# Patient Record
Sex: Female | Born: 1980 | Race: White | Hispanic: No | Marital: Single | Smoking: Current some day smoker
Health system: Southern US, Community
[De-identification: ages and names within clinical notes are randomized; demographics above are authoritative.]

## PROBLEM LIST (undated history)

## (undated) DIAGNOSIS — N200 Calculus of kidney: Secondary | ICD-10-CM

---

## 2014-10-10 ENCOUNTER — Emergency Department (HOSPITAL_BASED_OUTPATIENT_CLINIC_OR_DEPARTMENT_OTHER): Payer: Self-pay

## 2014-10-10 ENCOUNTER — Emergency Department (HOSPITAL_BASED_OUTPATIENT_CLINIC_OR_DEPARTMENT_OTHER)
Admission: EM | Admit: 2014-10-10 | Discharge: 2014-10-10 | Disposition: A | Discharge status: Home / Self Care | Payer: Self-pay | Attending: Emergency Medicine | Admitting: Emergency Medicine

## 2014-10-10 ENCOUNTER — Encounter (HOSPITAL_BASED_OUTPATIENT_CLINIC_OR_DEPARTMENT_OTHER): Payer: Self-pay | Admitting: Emergency Medicine

## 2014-10-10 DIAGNOSIS — K088 Other specified disorders of teeth and supporting structures: Secondary | ICD-10-CM | POA: Insufficient documentation

## 2014-10-10 DIAGNOSIS — K029 Dental caries, unspecified: Secondary | ICD-10-CM

## 2014-10-10 DIAGNOSIS — M549 Dorsalgia, unspecified: Secondary | ICD-10-CM | POA: Insufficient documentation

## 2014-10-10 DIAGNOSIS — N200 Calculus of kidney: Secondary | ICD-10-CM | POA: Insufficient documentation

## 2014-10-10 DIAGNOSIS — Z720 Tobacco use: Secondary | ICD-10-CM | POA: Insufficient documentation

## 2014-10-10 HISTORY — DX: Calculus of kidney: N20.0

## 2014-10-10 LAB — URINALYSIS, ROUTINE W REFLEX MICROSCOPIC
GLUCOSE, UA: NEGATIVE mg/dL
Ketones, ur: 15 mg/dL — AB
NITRITE: NEGATIVE
Protein, ur: NEGATIVE mg/dL
SPECIFIC GRAVITY, URINE: 1.018 (ref 1.005–1.030)
Urobilinogen, UA: 0.2 mg/dL (ref 0.0–1.0)
pH: 7 (ref 5.0–8.0)

## 2014-10-10 LAB — URINE MICROSCOPIC-ADD ON

## 2014-10-10 LAB — PREGNANCY, URINE: PREG TEST UR: NEGATIVE

## 2014-10-10 MED ORDER — PENICILLIN V POTASSIUM 500 MG PO TABS: 500.0000 mg | ORAL_TABLET | Freq: Three times a day (TID) | ORAL | Status: AC

## 2014-10-10 MED ORDER — TRAMADOL HCL 50 MG PO TABS: 50.0000 mg | ORAL_TABLET | Freq: Four times a day (QID) | ORAL | Status: AC | PRN

## 2014-10-10 MED ORDER — PENICILLIN V POTASSIUM 250 MG PO TABS
500.0000 mg | ORAL_TABLET | Freq: Once | ORAL | Status: AC
  Administered 2014-10-10: 500 mg via ORAL
  Filled 2014-10-10: qty 2

## 2014-10-10 MED ORDER — OXYCODONE-ACETAMINOPHEN 5-325 MG PO TABS
2.0000 | ORAL_TABLET | Freq: Once | ORAL | Status: AC
  Administered 2014-10-10: 2 via ORAL
  Filled 2014-10-10: qty 2

## 2014-10-10 NOTE — ED Provider Notes (Signed)
CSN: [id number]     Arrival date & time 10/10/14  1050 History   First MD Initiated Contact with Patient 10/10/14 1243     Chief Complaint  Patient presents with  . Dental Pain     (Consider location/radiation/quality/duration/timing/severity/associated sxs/prior Treatment) HPI Comments: Patient presents with a toothache. She describes pain to her right lower molar. This been going on for about a week has been worsening since yesterday. She denies any facial swelling. There is no nausea vomiting or fevers. She also has a history of kidney stones and is complaining of some pain in her right back that is moved to her right lower abdomen. She states it's been going on for about a week but is also been worsening since that time. It waxes and wanes in intensity. She was seen by a [facility]ist in [facility] earlier in the week but states that she was unable to stay for further evaluation such as a CT scan at that time. She had to pick up her child from school. She's also requesting for this to be evaluated today. She does report some increased urinary urgency but no visible blood in her urine. She denies any vaginal bleeding or discharge.  Patient is a 33 y.o. female presenting with tooth pain.  Dental Pain Associated symptoms: no congestion, no fever and no headaches     Past Medical History  Diagnosis Date  . Kidney stones    History reviewed. No pertinent past surgical history. No family history on file. History  Substance Use Topics  . Smoking status: Current Some Day Smoker  . Smokeless tobacco: Not on file  . Alcohol Use: Yes     Comment: occ   OB History    No data available     Review of Systems  Constitutional: Negative for fever, chills, diaphoresis and fatigue.  HENT: Positive for dental problem. Negative for congestion, rhinorrhea and sneezing.   Eyes: Negative.   Respiratory: Negative for cough, chest tightness and shortness of breath.   Cardiovascular: Negative for chest  pain and leg swelling.  Gastrointestinal: Positive for abdominal pain. Negative for nausea, vomiting, diarrhea and blood in stool.  Genitourinary: Positive for dysuria and flank pain. Negative for frequency, hematuria, vaginal discharge, difficulty urinating and vaginal pain.  Musculoskeletal: Positive for back pain. Negative for arthralgias.  Skin: Negative for rash.  Neurological: Negative for dizziness, speech difficulty, weakness, numbness and headaches.      Allergies  Review of patient's allergies indicates no known allergies.  Home Medications   Prior to Admission medications   Medication Sig Start Date End Date Taking? Authorizing Provider  ibuprofen (ADVIL,MOTRIN) 600 MG tablet Take 600 mg by mouth every 6 (six) hours as needed.   Yes Historical Provider, MD  penicillin v potassium (VEETID) 500 MG tablet Take 1 tablet (500 mg total) by mouth 3 (three) times daily. 10/10/14   Rolan BuccoMelanie Erendira Crabtree, MD  traMADol (ULTRAM) 50 MG tablet Take 1 tablet (50 mg total) by mouth every 6 (six) hours as needed. 10/10/14   Rolan BuccoMelanie Indiah Heyden, MD   BP 111/74 mmHg  Pulse 106  Temp(Src) 98 F (36.7 C) (Oral)  Resp 18  Wt 172 lb 8 oz (78.245 kg)  SpO2 100%  LMP 09/16/2014 Physical Exam  Constitutional: She is oriented to person, place, and time. She appears well-developed and well-nourished.  HENT:  Head: Normocephalic and atraumatic.  Positive decayed tooth that is eroded down to the gumline. This is the right lower first molar. There is  no swelling or fluctuance around the tooth. No facial swelling. Uvula is midline. No trismus. No elevation the tongue.  Eyes: Pupils are equal, round, and reactive to light.  Neck: Normal range of motion. Neck supple.  Cardiovascular: Normal rate, regular rhythm and normal heart sounds.   Pulmonary/Chest: Effort normal and breath sounds normal. No respiratory distress. She has no wheezes. She has no rales. She exhibits no tenderness.  Abdominal: Soft. Bowel sounds  are normal. There is tenderness (positive tenderness in the right flank and right midabdomen). There is no rebound and no guarding.  Musculoskeletal: Normal range of motion. She exhibits no edema.  Lymphadenopathy:    She has no cervical adenopathy.  Neurological: She is alert and oriented to person, place, and time.  Skin: Skin is warm and dry. No rash noted.  Psychiatric: She has a normal mood and affect.    ED Course  Procedures (including critical care time) Labs Review Labs Reviewed  URINALYSIS, ROUTINE W REFLEX MICROSCOPIC - Abnormal; Notable for the following:    Color, Urine AMBER (*)    APPearance CLOUDY (*)    Hgb urine dipstick LARGE (*)    Bilirubin Urine SMALL (*)    Ketones, ur 15 (*)    Leukocytes, UA SMALL (*)    All other components within normal limits  URINE MICROSCOPIC-ADD ON - Abnormal; Notable for the following:    Squamous Epithelial / LPF FEW (*)    Bacteria, UA MANY (*)    All other components within normal limits  PREGNANCY, URINE    Imaging Review Ct Renal Stone Study  10/10/2014   CLINICAL DATA:  Initial encounter for 3 day history of right flank pain and hematuria.  EXAM: CT ABDOMEN AND PELVIS WITHOUT CONTRAST  TECHNIQUE: Multidetector CT imaging of the abdomen and pelvis was performed following the standard protocol without IV contrast.  COMPARISON:  None.  FINDINGS: Lower chest:  Visualize lung bases are unremarkable.  Hepatobiliary: No focal abnormality in the liver on this study without intravenous contrast. No evidence for hepatomegaly. There is no evidence for gallstones, gallbladder wall thickening, or pericholecystic fluid. No intrahepatic or extrahepatic biliary dilation.  Pancreas: No focal mass lesion. No dilatation of the main duct. No intraparenchymal cyst. No peripancreatic edema.  Spleen: No splenomegaly. No focal mass lesion.  Adrenals/Urinary Tract: No adrenal nodule or mass. The right kidney is markedly atrophic with a very prominent  extrarenal pelvis. 4 millimeter interpolar stone is evident. No evidence for right ureteral stone.  Left kidney is normal by CT imaging without hydronephrosis or mass lesion. No left renal stone. No secondary changes in the left kidney or ureter. No left ureteral stone.  No bladder stone.  Stomach/Bowel: Stomach is nondistended. No gastric wall thickening. No evidence of outlet obstruction. Duodenum is normally positioned as is the ligament of Treitz. No small bowel wall thickening. No small bowel dilatation. Terminal ileum and appendix are normal No gross colonic mass. No colonic wall thickening. No substantial diverticular change.  Vascular/Lymphatic: No abdominal aortic aneurysm. There is no gastrohepatic ligament or hepatoduodenal ligament lymphadenopathy. No retroperitoneal lymphadenopathy. No evidence for pelvic sidewall lymphadenopathy.  Reproductive: Uterus is normal in appearance. There is no adnexal mass.  Other: No intraperitoneal free fluid.  No ventral hernia.  Musculoskeletal: Bone windows reveal no worrisome lytic or sclerotic osseous lesions.  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 4 millimeter nonobstructing stone is seen in the interpolar right kidney.   Electronically  Signed   By: Kennith Center M.D.   On: 10/10/2014 13:50     EKG Interpretation None      MDM   Final diagnoses:  Renal stone  Pain due to dental caries    Patient's first complaint is dental pain. There is no evidence of periapical abscess. I will start her on penicillin and Ultram for pain. She also has pain to her right flank consistent with her past kidney stone type pain. Her urine does not appear infected. CT scan shows a 4 mm right renal stone with evidence of an atrophic kidney. I encouraged patient to have dental follow-up as well as to return follow-up with a urologist that she's previously seen. I advised her return here if she has any  worsening symptoms.    Rolan Bucco, MD 10/10/14 [phone]

## 2014-10-10 NOTE — ED Notes (Signed)
Toothache to bottom lower right molar. Known bad tooth per patient. Pt couldn't get into dentist due to being closed.

## 2014-10-10 NOTE — Discharge Instructions (Signed)
Dental Pain A tooth ache may be caused by cavities (tooth decay). Cavities expose the nerve of the tooth to air and hot or cold temperatures. It may come from an infection or abscess (also called a boil or furuncle) around your tooth. It is also often caused by dental caries (tooth decay). This causes the pain you are having. DIAGNOSIS  Your caregiver can diagnose this problem by exam. TREATMENT   If caused by an infection, it may be treated with medications which kill germs (antibiotics) and pain medications as prescribed by your caregiver. Take medications as directed.  Only take over-the-counter or prescription medicines for pain, discomfort, or fever as directed by your caregiver.  Whether the tooth ache today is caused by infection or dental disease, you should see your dentist as soon as possible for further care. SEEK MEDICAL CARE IF: The exam and treatment you received today has been provided on an emergency basis only. This is not a substitute for complete medical or dental care. If your problem worsens or new problems (symptoms) appear, and you are unable to meet with your dentist, call or return to this location. SEEK IMMEDIATE MEDICAL CARE IF:   You have a fever.  You develop redness and swelling of your face, jaw, or neck.  You are unable to open your mouth.  You have severe pain uncontrolled by pain medicine. MAKE SURE YOU:   Understand these instructions.  Will watch your condition.  Will get help right away if you are not doing well or get worse. Document Released: 11/07/2005 Document Revised: 01/30/2012 Document Reviewed: 06/25/2008 [facility] Patient Information 2015 [employer], [place]. This information is not intended to replace advice given to you by your health care provider. Make sure you discuss any questions you have with your health care provider.  Kidney Stones Kidney stones (urolithiasis) are deposits that form inside your kidneys. The intense pain is caused by  the stone moving through the urinary tract. When the stone moves, the ureter goes into spasm around the stone. The stone is usually passed in the urine.  CAUSES   A disorder that makes certain neck glands produce too much parathyroid hormone (primary hyperparathyroidism).  A buildup of uric acid crystals, similar to gout in your joints.  Narrowing (stricture) of the ureter.  A kidney obstruction present at birth (congenital obstruction).  Previous surgery on the kidney or ureters.  Numerous kidney infections. SYMPTOMS   Feeling sick to your stomach (nauseous).  Throwing up (vomiting).  Blood in the urine (hematuria).  Pain that usually spreads (radiates) to the groin.  Frequency or urgency of urination. DIAGNOSIS   Taking a history and physical exam.  Blood or urine tests.  CT scan.  Occasionally, an examination of the inside of the urinary bladder (cystoscopy) is performed. TREATMENT   Observation.  Increasing your fluid intake.  Extracorporeal shock wave lithotripsy--This is a noninvasive procedure that uses shock waves to break up kidney stones.  Surgery may be needed if you have severe pain or persistent obstruction. There are various surgical procedures. Most of the procedures are performed with the use of small instruments. Only small incisions are needed to accommodate these instruments, so recovery time is minimized. The size, location, and chemical composition are all important variables that will determine the proper choice of action for you. Talk to your health care provider to better understand your situation so that you will minimize the risk of injury to yourself and your kidney.  HOME CARE INSTRUCTIONS  Drink enough water and fluids to keep your urine clear or pale yellow. This will help you to pass the stone or stone fragments.  Strain all urine through the provided strainer. Keep all particulate matter and stones for your health care provider to see.  The stone causing the pain may be as small as a grain of salt. It is very important to use the strainer each and every time you pass your urine. The collection of your stone will allow your health care provider to analyze it and verify that a stone has actually passed. The stone analysis will often identify what you can do to reduce the incidence of recurrences.  Only take over-the-counter or prescription medicines for pain, discomfort, or fever as directed by your health care provider.  Make a follow-up appointment with your health care provider as directed.  Get follow-up X-rays if required. The absence of pain does not always mean that the stone has passed. It may have only stopped moving. If the urine remains completely obstructed, it can cause loss of kidney function or even complete destruction of the kidney. It is your responsibility to make sure X-rays and follow-ups are completed. Ultrasounds of the kidney can show blockages and the status of the kidney. Ultrasounds are not associated with any radiation and can be performed easily in a matter of minutes. SEEK MEDICAL CARE IF:  You experience pain that is progressive and unresponsive to any pain medicine you have been prescribed. SEEK IMMEDIATE MEDICAL CARE IF:   Pain cannot be controlled with the prescribed medicine.  You have a fever or shaking chills.  The severity or intensity of pain increases over 18 hours and is not relieved by pain medicine.  You develop a new onset of abdominal pain.  You feel faint or pass out.  You are unable to urinate. MAKE SURE YOU:   Understand these instructions.  Will watch your condition.  Will get help right away if you are not doing well or get worse. Document Released: 11/07/2005 Document Revised: 07/10/2013 Document Reviewed: 04/10/2013 [facility] SoutheastExitCare Patient Information 2015 [place], [place]. This information is not intended to replace advice given to you by your health care provider. Make sure  you discuss any questions you have with your health care provider.

## 2015-11-01 IMAGING — CT CT RENAL STONE PROTOCOL
2 of 4 series · 15 of 46 positions shown, 17 images · non-contrast
Comparison: None.

CLINICAL DATA: Initial encounter for 3 day history of right flank
pain and hematuria.

EXAM:
CT ABDOMEN AND PELVIS WITHOUT CONTRAST
TECHNIQUE: Multidetector CT imaging of the abdomen and pelvis was performed
following the standard protocol without IV contrast.

[Series 2: renal stone < 200 lbs 5.0 b31f · axial · 0.70mm/px · z∈[+559,+969]mm · 12 of 98 slices shown, 14 images]
[im 8/98  soft-tissue]
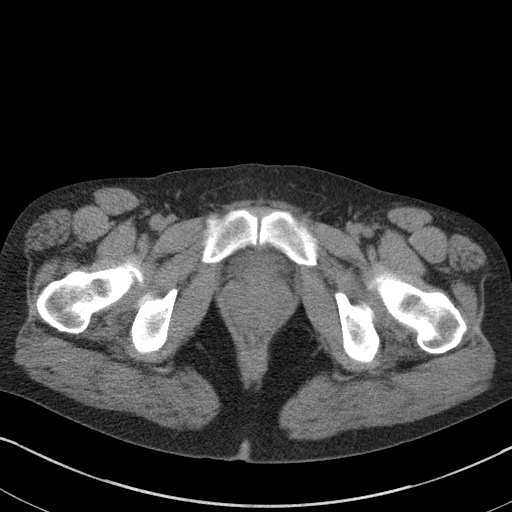
[im 8/98  bone]
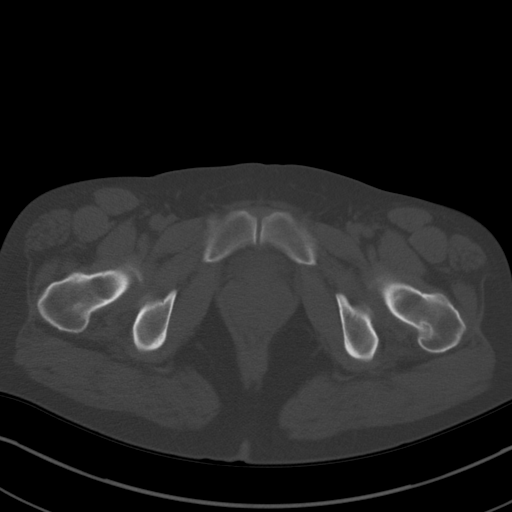
[im 15/98  soft-tissue]
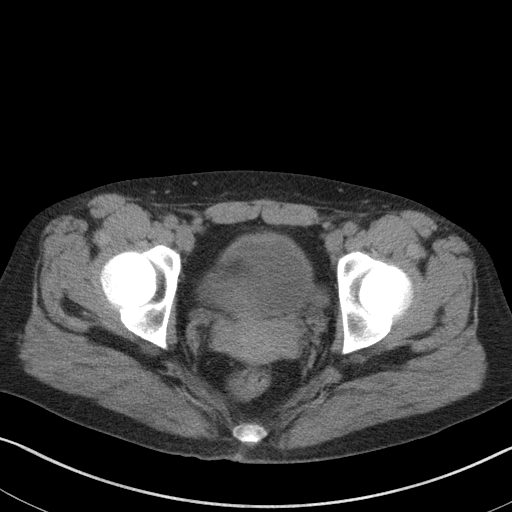
[im 23/98  soft-tissue]
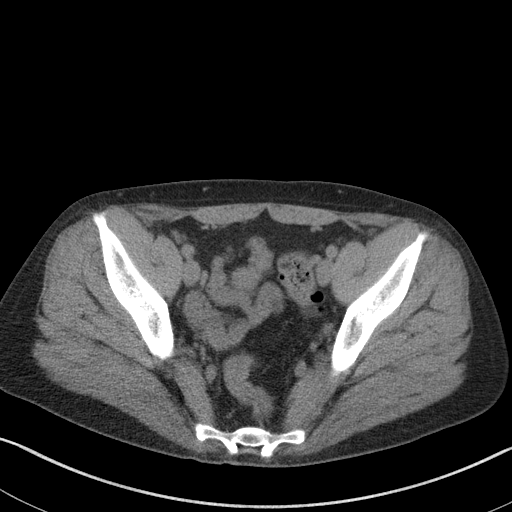
[im 30/98  soft-tissue]
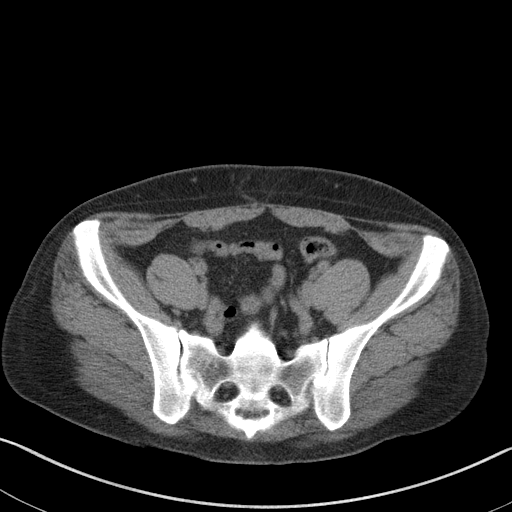
[im 38/98  soft-tissue]
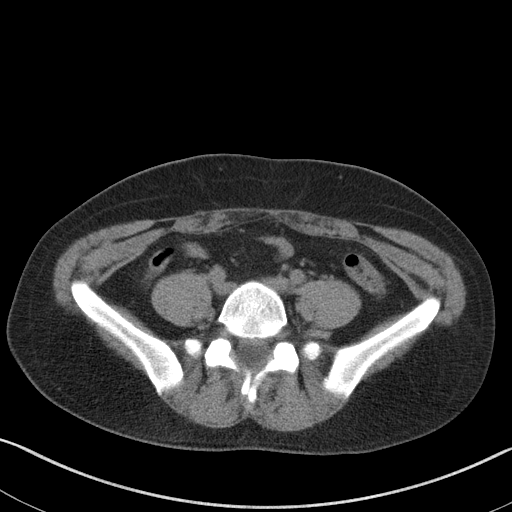
[im 45/98  soft-tissue]
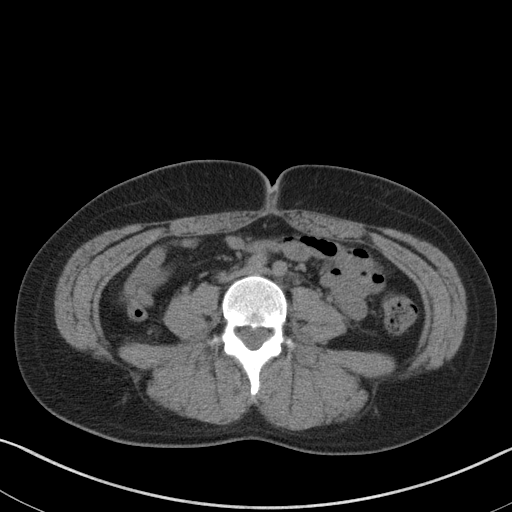
[im 53/98  soft-tissue]
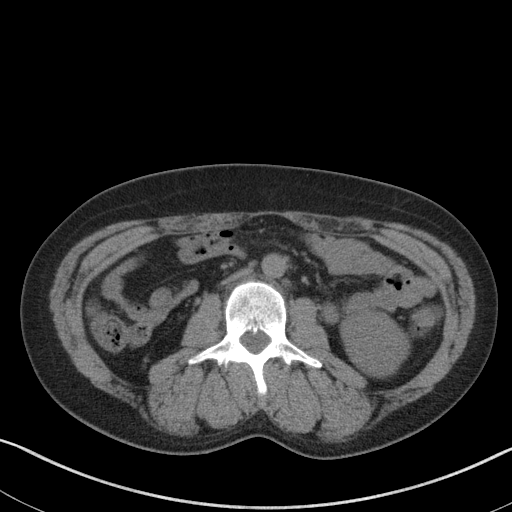
[im 60/98  soft-tissue]
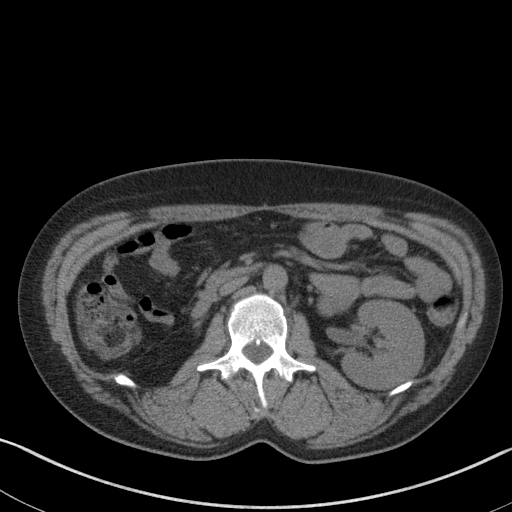
[im 68/98  soft-tissue]
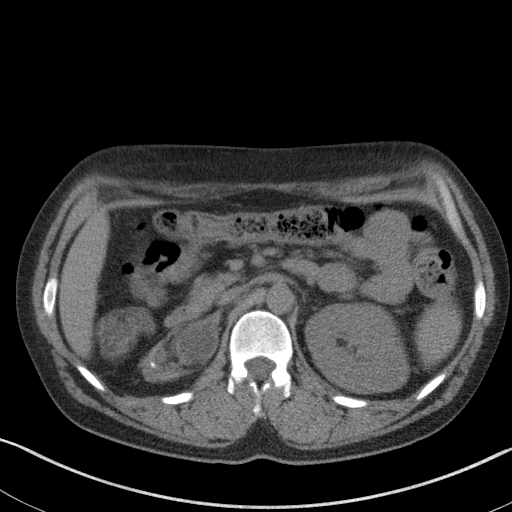
[im 68/98  bone]
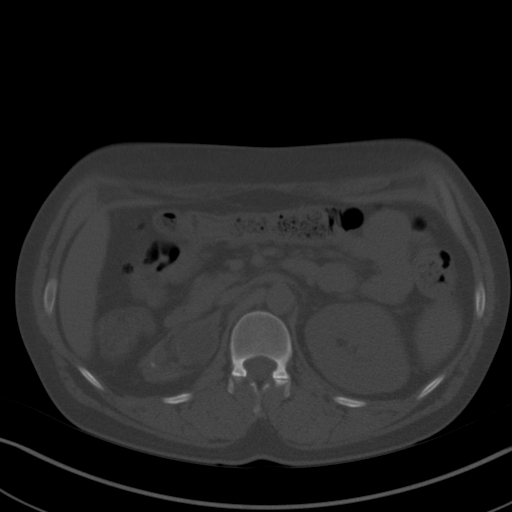
[im 75/98  soft-tissue]
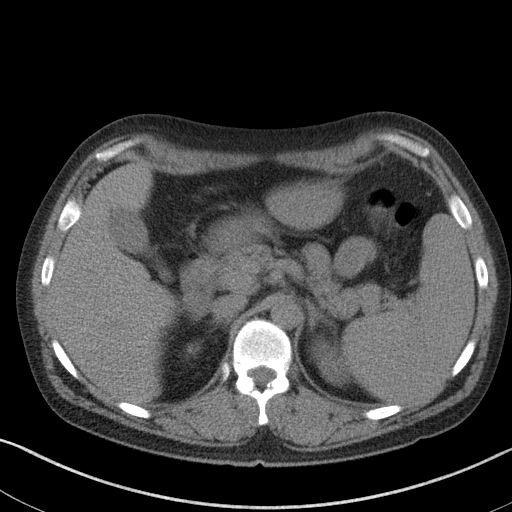
[im 83/98  soft-tissue]
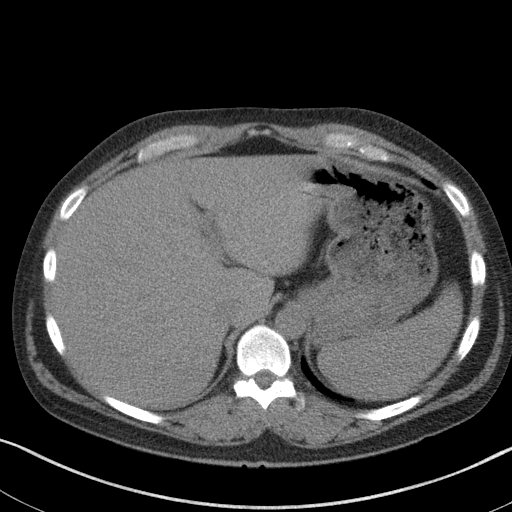
[im 90/98  soft-tissue]
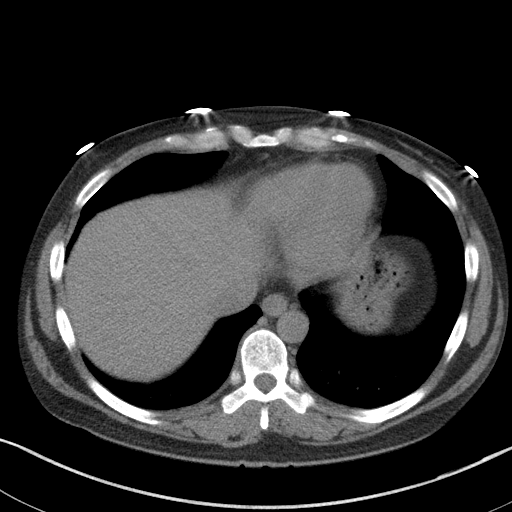

[Series 5: renal stone 3.0 coronal · coronal · 0.71mm/px · 3 of 85 slices shown]
[im 29/85  soft-tissue]
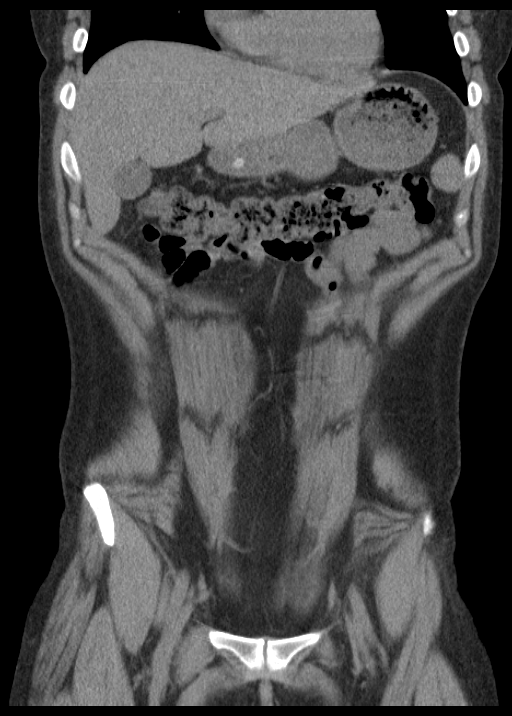
[im 38/85  soft-tissue]
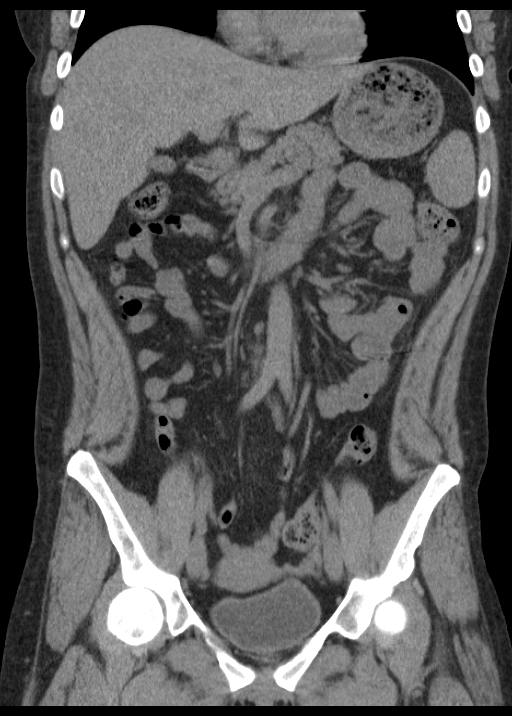
[im 47/85  soft-tissue]
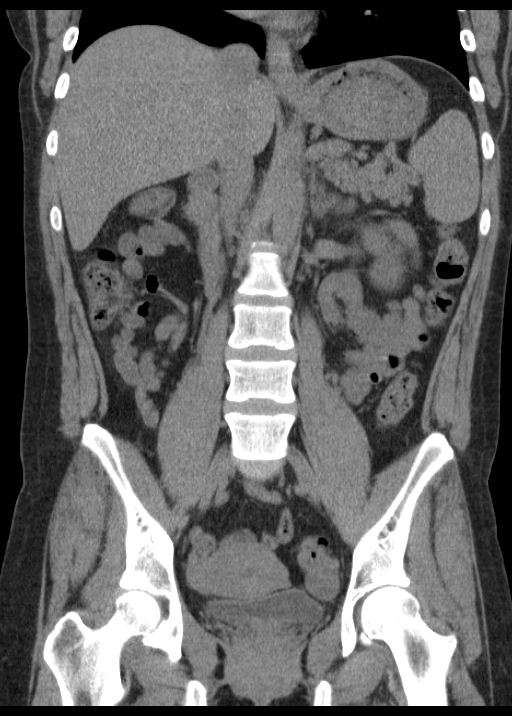

[15 of 46 positions shown; findings below may reference images not displayed]

FINDINGS: Lower chest:  Visualize lung bases are unremarkable.

Hepatobiliary: No focal abnormality in the liver on this study
without intravenous contrast. No evidence for hepatomegaly. There is
no evidence for gallstones, gallbladder wall thickening, or
pericholecystic fluid. No intrahepatic or extrahepatic biliary
dilation.

Pancreas: No focal mass lesion. No dilatation of the main duct. No
intraparenchymal cyst. No peripancreatic edema.

Spleen: No splenomegaly. No focal mass lesion.

Adrenals/Urinary Tract: No adrenal nodule or mass. The right kidney
is markedly atrophic with a very prominent extrarenal pelvis. 4
millimeter interpolar stone is evident. No evidence for right
ureteral stone.

Left kidney is normal by CT imaging without hydronephrosis or mass
lesion. No left renal stone. No secondary changes in the left kidney
or ureter. No left ureteral stone.

No bladder stone.

Stomach/Bowel: Stomach is nondistended. No gastric wall thickening.
No evidence of outlet obstruction. Duodenum is normally positioned
as is the ligament of Treitz. No small bowel wall thickening. No
small bowel dilatation. Terminal ileum and appendix are normal No
gross colonic mass. No colonic wall thickening. No substantial
diverticular change.

Vascular/Lymphatic: No abdominal aortic aneurysm. There is no
gastrohepatic ligament or hepatoduodenal ligament lymphadenopathy.
No retroperitoneal lymphadenopathy. No evidence for pelvic sidewall
lymphadenopathy.

Reproductive: Uterus is normal in appearance. There is no adnexal
mass.

Other: No intraperitoneal free fluid.  No ventral hernia.

Musculoskeletal: Bone windows reveal no worrisome lytic or sclerotic
osseous lesions.
IMPRESSION: Atrophic right kidney with chronic features, potentially secondary
to chronic UPJ obstruction. 4 millimeter nonobstructing stone is
seen in the interpolar right kidney.
